# Patient Record
Sex: Female | Born: 2000 | Race: Black or African American | Hispanic: No | Marital: Single | Smoking: Never smoker
Health system: Southern US, Community
[De-identification: ages and names within clinical notes are randomized; demographics above are authoritative.]

## PROBLEM LIST (undated history)

## (undated) DIAGNOSIS — F329 Major depressive disorder, single episode, unspecified: Secondary | ICD-10-CM

## (undated) DIAGNOSIS — F32A Depression, unspecified: Secondary | ICD-10-CM

## (undated) DIAGNOSIS — F419 Anxiety disorder, unspecified: Secondary | ICD-10-CM

## (undated) HISTORY — DX: Depression, unspecified: F32.A

## (undated) HISTORY — DX: Anxiety disorder, unspecified: F41.9

---
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
## 2020-02-03 ENCOUNTER — Telehealth (INDEPENDENT_AMBULATORY_CARE_PROVIDER_SITE_OTHER): Payer: Self-pay | Admitting: Psychiatry

## 2020-02-03 ENCOUNTER — Encounter (HOSPITAL_COMMUNITY): Payer: Self-pay | Admitting: Psychiatry

## 2020-02-03 ENCOUNTER — Other Ambulatory Visit: Payer: Self-pay

## 2020-02-03 DIAGNOSIS — F3342 Major depressive disorder, recurrent, in full remission: Secondary | ICD-10-CM

## 2020-02-03 MED ORDER — BUPROPION HCL ER (XL) 150 MG PO TB24: 150.0000 mg | ORAL_TABLET | ORAL | 1 refills | Status: DC

## 2020-02-03 NOTE — Progress Notes (Signed)
BH MD OP Progress Note  Virtual Visit via Telephone Note  I connected with Tamara Peters on 02/03/20 at  4:00 PM EST by telephone and verified that I am speaking with the correct person using two identifiers.  Location: Patient: home Provider: Clinic   I discussed the limitations, risks, security and privacy concerns of performing an evaluation and management service by telephone and the availability of in person appointments. I also discussed with the patient that there may be a patient responsible charge related to this service. The patient expressed understanding and agreed to proceed.   I provided 14 minutes of non-face-to-face time during this encounter.   02/03/2020 4:04 PM Tamara Peters  MRN:  [id number]  Chief Complaint:  " Its been going all right."  HPI: Patient reported that everything is going well.  She had a good Christmas holiday with her family.  She is currently at home in [place] and is attending online classes in [place].  She is returning back to [place] to live on campus in the next couple of weeks.  She stated that everything is going well for now and she is stable on her current regimen.  She denies any acute issues or concerns at this time.  Visit Diagnosis:    ICD-10-CM   1. Recurrent major depressive disorder, in full remission (HCC)  F33.42     Past Psychiatric History: MDD  Past Medical History:  Past Medical History:  Diagnosis Date  . Anxiety   . Depression    No past surgical history on file.  Family Psychiatric History: denied  Family History: No family history on file.  Social History:  Social History   Socioeconomic History  . Marital status: Single    Spouse name: Not on file  . Number of children: 0  . Years of education: Not on file  . Highest education level: 12th grade  Occupational History  . Not on file  Tobacco Use  . Smoking status: Never Smoker  . Smokeless tobacco: Never Used  Vaping Use  . Vaping  Use: Never used  Substance and Sexual Activity  . Alcohol use: Never    Alcohol/week: 0.0 standard drinks  . Drug use: Never  . Sexual activity: Never  Other Topics Concern  . Not on file  Social History Narrative  . Not on file   Social Determinants of Health   Financial Resource Strain: Not on file  Food Insecurity: Not on file  Transportation Needs: Not on file  Physical Activity: Not on file  Stress: Not on file  Social Connections: Not on file    Allergies:  Allergies  Allergen Reactions  . Penicillins Rash    Metabolic Disorder Labs: No results found for: HGBA1C, MPG No results found for: PROLACTIN No results found for: CHOL, TRIG, HDL, CHOLHDL, VLDL, LDLCALC No results found for: TSH  Therapeutic Level Labs: No results found for: LITHIUM No results found for: VALPROATE No components found for:  CBMZ  Current Medications: Current Outpatient Medications  Medication Sig Dispense Refill  . buPROPion (WELLBUTRIN XL) 150 MG 24 hr tablet Take 1 tablet (150 mg total) by mouth every morning. 90 tablet 1  . norethindrone-ethinyl estradiol (LOESTRIN FE) 1-20 MG-MCG tablet Take by mouth.     No current facility-administered medications for this visit.     Musculoskeletal: Strength & Muscle Tone: unable to assess due to telemed visit Gait & Station: unable to assess due to telemed visit Patient leans: unable to assess due  to telemed visit   Psychiatric Specialty Exam: Review of Systems  There were no vitals taken for this visit.There is no height or weight on file to calculate BMI.  General Appearance: unable to asses due to phone visit  Eye Contact:  unable to asses due to phone visit  Speech:  Clear and Coherent and Normal Rate  Volume:  Normal  Mood:  Euthymic  Affect:  Appropriate and Congruent  Thought Process:  Goal Directed, Linear and Descriptions of Associations: Intact  Orientation:  Full (Time, Place, and Person)  Thought Content: Logical    Suicidal Thoughts:  No  Homicidal Thoughts:  No  Memory:  Recent;   Good Remote;   Good  Judgement:  Fair  Insight:  Fair  Psychomotor Activity:  Normal  Concentration:  Concentration: Good and Attention Span: Good  Recall:  Good  Fund of Knowledge: Good  Language: Good  Akathisia:  Negative  Handed:  Right  AIMS (if indicated): not done  Assets:  Communication Skills Desire for Improvement Financial Resources/Insurance Housing Social Support Talents/Skills  ADL's:  Intact  Cognition: WNL  Sleep:  Good     Assessment and Plan: Patient denies any new concerns or issues today..  1. Recurrent major depressive disorder, in full remission (HCC)  - buPROPion (WELLBUTRIN XL) 150 MG 24 hr tablet; Take 1 tablet (150 mg total) by mouth every morning.  Dispense: 90 tablet; Refill: 1   Continue same medication regimen. Follow up in 3 months.   Zena Amos, MD 02/03/2020, 4:04 PM

## 2020-03-22 ENCOUNTER — Telehealth (HOSPITAL_COMMUNITY): Payer: Self-pay | Admitting: Psychiatry

## 2020-03-22 ENCOUNTER — Other Ambulatory Visit: Payer: Self-pay

## 2020-03-22 NOTE — Telephone Encounter (Signed)
Pt was scheduled for an urgent virtual visit today at 10 am, however, when the writer sent the link her mother contacted the front desk staff to inform that pt was currently at [facility] ER and had been evaluated by psychiatry team and they were recommending in-patient stabilization.  The appointment with the writer was therefore cancelled.

## 2020-04-06 ENCOUNTER — Encounter (HOSPITAL_COMMUNITY): Payer: Self-pay | Admitting: Psychiatry

## 2020-04-06 ENCOUNTER — Telehealth (INDEPENDENT_AMBULATORY_CARE_PROVIDER_SITE_OTHER): Payer: Self-pay | Admitting: Psychiatry

## 2020-04-06 ENCOUNTER — Other Ambulatory Visit: Payer: Self-pay

## 2020-04-06 DIAGNOSIS — F3341 Major depressive disorder, recurrent, in partial remission: Secondary | ICD-10-CM

## 2020-04-06 MED ORDER — FLUOXETINE HCL 20 MG PO CAPS: 20.0000 mg | ORAL_CAPSULE | Freq: Every day | ORAL | 0 refills | Status: DC

## 2020-04-06 MED ORDER — BUPROPION HCL ER (XL) 150 MG PO TB24: 150.0000 mg | ORAL_TABLET | ORAL | 0 refills | Status: DC

## 2020-04-06 MED ORDER — TRAZODONE HCL 100 MG PO TABS: 100.0000 mg | ORAL_TABLET | Freq: Every evening | ORAL | 0 refills | Status: DC | PRN

## 2020-04-06 NOTE — Progress Notes (Signed)
[facility] MD OP Progress Note  Virtual Visit via Video Note  I connected with Tamara Peters on 04/06/20 at 10:10 AM EDT by a video enabled telemedicine application and verified that I am speaking with the correct person using two identifiers.  Location: Patient: Work/[employer] Provider: Clinic   I discussed the limitations of evaluation and management by telemedicine and the availability of in person appointments. The patient expressed understanding and agreed to proceed.  I provided 18 minutes of non-face-to-face time during this encounter.  Extensive amount of time was spent in reviewing the EMR regarding her recent psychiatry ED visit and hospitalization.      04/06/2020 10:14 AM Tamara Peters  MRN:  [id number]  Chief Complaint:  " I am doing a little better."  HPI: Patient had last met with writer in early January 2022.  At that time patient reported that she was doing well and was planning to attend online classes with [employer] while living on campus and working at [employer] there.  Patient's mother had contacted the clinic in February to report the patient was not doing well and had expressed suicidal ideations.  Patient was taken to [facility] emergency department in [place] on February 26 where she was assessed by psychiatry team.  She was noted to have depression symptoms with active suicidal ideations with plan and therefore was recommended transfer to an inpatient psychiatry facility for further stabilization.  At that time patient had reported that she was very stressed and overwhelmed because of relationship conflict with her significant other.  She had undergone an abortion in November 2021 and since then had felt really guilty and depressed. Patient was transferred to [facility] psychiatry hospital where she stayed for 8 days.  Today, patient stated that she is feeling better compared to the past few months.  She reported that she was discharged on Wellbutrin XL  150 mg every morning, Prozac 20 mg daily and trazodone 200 mg at bedtime.  She stated that her mood is better but she feels sluggish during the day.  She was asked if she felt trazodone was too strong for her and she replied maybe yes.  She stated that trazodone has helped her immensely with sleep so she would like to continue it however is agreeable to cutting down the dose due to daytime sluggishness. She informed that she ended the relationship with her partner and in the way she is happy about that but also misses them.  She stated that anxiety much better now but not perfect. She has resumed her work at [employer] and is still attending classes online at [employer].  She informed that her mother has been very supportive of her and she has shared everything with her.  She has informed her about her abortion last year and everything else she has been through. Today, she denied anhedonia or feelings of helplessness or hopelessness.  She denied having any suicidal ideation since she got discharged from the hospital. She reported her concentration and appetite are okay.  Her sleep is improved with the help of trazodone but she feels kind of sluggish in the daytime and is agreeable to reducing the dose for the same reason. She denied having any urges to engage in self-injurious behaviors. She denied any hypomanic or manic symptoms. She denied any psychotic symptoms such as hallucinations or delusions.  She wants to continue same regimen for now and continue to focus on her work and school.  She does  not want to be connected with therapist at this juncture.    Visit Diagnosis:    ICD-10-CM   1. Recurrent major depressive disorder, in full remission ([place])  F33.42     Past Psychiatric History: MDD  Past Medical History:  Past Medical History:  Diagnosis Date  . Anxiety   . Depression    No past surgical history on file.  Family Psychiatric History: denied  Family History: No family history  on file.  Social History:  Social History   Socioeconomic History  . Marital status: Single    Spouse name: Not on file  . Number of children: 0  . Years of education: Not on file  . Highest education level: 12th grade  Occupational History  . Not on file  Tobacco Use  . Smoking status: Never Smoker  . Smokeless tobacco: Never Used  Vaping Use  . Vaping Use: Never used  Substance and Sexual Activity  . Alcohol use: Never    Alcohol/week: 0.0 standard drinks  . Drug use: Never  . Sexual activity: Never  Other Topics Concern  . Not on file  Social History Narrative  . Not on file   Social Determinants of Health   Financial Resource Strain: Not on file  Food Insecurity: Not on file  Transportation Needs: Not on file  Physical Activity: Not on file  Stress: Not on file  Social Connections: Not on file    Allergies:  Allergies  Allergen Reactions  . Penicillins Rash    Metabolic Disorder Labs: No results found for: HGBA1C, MPG No results found for: PROLACTIN No results found for: CHOL, TRIG, HDL, CHOLHDL, VLDL, LDLCALC No results found for: TSH  Therapeutic Level Labs: No results found for: LITHIUM No results found for: VALPROATE No components found for:  CBMZ  Current Medications: Current Outpatient Medications  Medication Sig Dispense Refill  . buPROPion (WELLBUTRIN XL) 150 MG 24 hr tablet Take 1 tablet (150 mg total) by mouth every morning. 90 tablet 1  . norethindrone-ethinyl estradiol (LOESTRIN FE) 1-20 MG-MCG tablet Take by mouth.     No current facility-administered medications for this visit.      Psychiatric Specialty Exam: Review of Systems  There were no vitals taken for this visit.There is no height or weight on file to calculate BMI.  General Appearance: Well Groomed  Eye Contact:  Good  Speech:  Clear and Coherent and Normal Rate  Volume:  Normal  Mood:  Slightly depressed  Affect:  Appropriate and Congruent  Thought Process:  Goal  Directed, Linear and Descriptions of Associations: Intact  Orientation:  Full (Time, Place, and Person)  Thought Content: Logical   Suicidal Thoughts:  No  Homicidal Thoughts:  No  Memory:  Recent;   Good Remote;   Good  Judgement:  Fair  Insight:  Fair  Psychomotor Activity:  Normal  Concentration:  Concentration: Good and Attention Span: Good  Recall:  Good  Fund of Knowledge: Good  Language: Good  Akathisia:  Negative  Handed:  Right  AIMS (if indicated): not done  Assets:  Communication Skills Desire for Improvement Financial Resources/Insurance Housing Social Support Talents/Skills  ADL's:  Intact  Cognition: WNL  Sleep:  Good     Assessment and Plan: Patient appears to be doing fairly well after being discharged from the hospital in February.  Her medication regimen was adjusted after her hospitalization and Prozac and trazodone were added to her regimen.  She reported that trazodone has helped her sleep  well but she feels kind of sluggish during the daytime and therefore was agreeable to reducing the dose to 100 mg from 200 mg.  1. MDD (major depressive disorder), recurrent, in partial remission (HCC)  - buPROPion (WELLBUTRIN XL) 150 MG 24 hr tablet; Take 1 tablet (150 mg total) by mouth every morning.  Dispense: 90 tablet; Refill: 0 - FLUoxetine (PROZAC) 20 MG capsule; Take 1 capsule (20 mg total) by mouth daily.  Dispense: 90 capsule; Refill: 0 - Reduce traZODone (DESYREL) 100 MG tablet; Take 1 tablet (100 mg total) by mouth at bedtime as needed for sleep.  Dispense: 90 tablet; Refill: 0  F/up in 6 weeks.   Nevada Crane, MD 04/06/2020, 10:14 AM

## 2020-04-29 ENCOUNTER — Telehealth (HOSPITAL_COMMUNITY): Payer: Self-pay | Admitting: Psychiatry

## 2020-05-18 ENCOUNTER — Other Ambulatory Visit: Payer: Self-pay

## 2020-05-18 ENCOUNTER — Telehealth (INDEPENDENT_AMBULATORY_CARE_PROVIDER_SITE_OTHER): Payer: Self-pay | Admitting: Psychiatry

## 2020-05-18 ENCOUNTER — Encounter (HOSPITAL_COMMUNITY): Payer: Self-pay | Admitting: Psychiatry

## 2020-05-18 DIAGNOSIS — F3341 Major depressive disorder, recurrent, in partial remission: Secondary | ICD-10-CM

## 2020-05-18 MED ORDER — FLUOXETINE HCL 20 MG PO CAPS: 20.0000 mg | ORAL_CAPSULE | Freq: Every day | ORAL | 0 refills | Status: DC

## 2020-05-18 MED ORDER — TRAZODONE HCL 100 MG PO TABS: 100.0000 mg | ORAL_TABLET | Freq: Every evening | ORAL | 0 refills | Status: AC | PRN

## 2020-05-18 NOTE — Progress Notes (Signed)
BH MD OP Progress Note  Virtual Visit via Video Note  I connected with Tamara Peters on 05/18/20 at  2:40 PM EDT by a video enabled telemedicine application and verified that I am speaking with the correct person using two identifiers.  Location: Patient: Work Provider: Clinic   I discussed the limitations of evaluation and management by telemedicine and the availability of in person appointments. The patient expressed understanding and agreed to proceed.  I provided 15 minutes of non-face-to-face time during this encounter.      05/18/2020 2:57 PM Tamara Peters  MRN:  [id number]  Chief Complaint:  " I have been busy with work."  HPI: Patient reported she is doing better.  She informed that she is staying busy with work.  She now works at [employer] in [place].  She informed that things have been going okay for her.  She has not had any contact with her ex. She has not had any suicidal ideations since her suicide attempt a couple of months ago. She informed that she noticed that Wellbutrin was making her really nauseous so she stopped taking it a few weeks ago and she feels better now.  She has also not been taking her trazodone on a regular basis because she can sleep well without it.  She has been taking Prozac regularly on a daily basis and finds it helpful. She stated that she has been doing online classes and was supposed to have some exams. She denies any other concerns or issues at this time.    Visit Diagnosis:    ICD-10-CM   1. MDD (major depressive disorder), recurrent, in partial remission (HCC)  F33.41 FLUoxetine (PROZAC) 20 MG capsule    traZODone (DESYREL) 100 MG tablet    Past Psychiatric History: MDD  Past Medical History:  Past Medical History:  Diagnosis Date  . Anxiety   . Depression    No past surgical history on file.  Family Psychiatric History: denied  Family History: No family history on file.  Social History:  Social History    Socioeconomic History  . Marital status: Single    Spouse name: Not on file  . Number of children: 0  . Years of education: Not on file  . Highest education level: 12th grade  Occupational History  . Not on file  Tobacco Use  . Smoking status: Never Smoker  . Smokeless tobacco: Never Used  Vaping Use  . Vaping Use: Never used  Substance and Sexual Activity  . Alcohol use: Never    Alcohol/week: 0.0 standard drinks  . Drug use: Never  . Sexual activity: Never  Other Topics Concern  . Not on file  Social History Narrative  . Not on file   Social Determinants of Health   Financial Resource Strain: Not on file  Food Insecurity: Not on file  Transportation Needs: Not on file  Physical Activity: Not on file  Stress: Not on file  Social Connections: Not on file    Allergies:  Allergies  Allergen Reactions  . Penicillins Rash    Metabolic Disorder Labs: No results found for: HGBA1C, MPG No results found for: PROLACTIN No results found for: CHOL, TRIG, HDL, CHOLHDL, VLDL, LDLCALC No results found for: TSH  Therapeutic Level Labs: No results found for: LITHIUM No results found for: VALPROATE No components found for:  CBMZ  Current Medications: Current Outpatient Medications  Medication Sig Dispense Refill  . FLUoxetine (PROZAC) 20 MG capsule Take 1 capsule (20  mg total) by mouth daily. 90 capsule 0  . norethindrone-ethinyl estradiol (LOESTRIN FE) 1-20 MG-MCG tablet Take by mouth.    . traZODone (DESYREL) 100 MG tablet Take 1 tablet (100 mg total) by mouth at bedtime as needed for sleep. 90 tablet 0   No current facility-administered medications for this visit.      Psychiatric Specialty Exam: Review of Systems  There were no vitals taken for this visit.There is no height or weight on file to calculate BMI.  General Appearance: Well Groomed, wearing a work uniform  Patent attorney:  Good  Speech:  Clear and Coherent and Normal Rate  Volume:  Normal  Mood:   Euthymic  Affect:  Appropriate and Congruent  Thought Process:  Goal Directed, Linear and Descriptions of Associations: Intact  Orientation:  Full (Time, Place, and Person)  Thought Content: Logical   Suicidal Thoughts:  No  Homicidal Thoughts:  No  Memory:  Recent;   Good Remote;   Good  Judgement:  Fair  Insight:  Fair  Psychomotor Activity:  Normal  Concentration:  Concentration: Good and Attention Span: Good  Recall:  Good  Fund of Knowledge: Good  Language: Good  Akathisia:  Negative  Handed:  Right  AIMS (if indicated): not done  Assets:  Communication Skills Desire for Improvement Financial Resources/Insurance Housing Social Support Talents/Skills  ADL's:  Intact  Cognition: WNL  Sleep:  Good     Assessment and Plan: Patient appears to be doing fairly well for now.  She stopped taking Wellbutrin due to nausea.  She also continues trazodone on a regular basis as she is able to sleep without it.  1. MDD (major depressive disorder), recurrent, in partial remission (HCC) -  FLUoxetine (PROZAC) 20 MG capsule; Take 1 capsule (20 mg total) by mouth daily.  Dispense: 90 capsule; Refill: 0 - traZODone (DESYREL) 100 MG tablet; Take 1 tablet (100 mg total) by mouth at bedtime as needed for sleep.  Dispense: 90 tablet; Refill: 0  Continue same medication regimen. Follow up in 2 months. Patient was informed that her care is being transferred to a different provider at [facility] psychiatry clinic.  She verbalized her understanding.    Zena Amos, MD 05/18/2020, 2:57 PM

## 2020-06-18 ENCOUNTER — Ambulatory Visit: Payer: BC Managed Care – PPO

## 2020-06-28 ENCOUNTER — Ambulatory Visit: Payer: BC Managed Care – PPO | Admitting: Physician Assistant

## 2020-06-28 ENCOUNTER — Other Ambulatory Visit: Payer: Self-pay

## 2020-06-28 DIAGNOSIS — N760 Acute vaginitis: Secondary | ICD-10-CM

## 2020-06-28 DIAGNOSIS — Z113 Encounter for screening for infections with a predominantly sexual mode of transmission: Secondary | ICD-10-CM

## 2020-06-28 DIAGNOSIS — B9689 Other specified bacterial agents as the cause of diseases classified elsewhere: Secondary | ICD-10-CM

## 2020-06-28 LAB — WET PREP FOR TRICH, YEAST, CLUE
Trichomonas Exam: NEGATIVE
Yeast Exam: NEGATIVE

## 2020-06-28 MED ORDER — METRONIDAZOLE 500 MG PO TABS: 500.0000 mg | ORAL_TABLET | Freq: Two times a day (BID) | ORAL | 0 refills | Status: AC

## 2020-06-28 NOTE — Progress Notes (Signed)
Wet mount reviewed by provider. Orders completed

## 2020-06-29 ENCOUNTER — Encounter: Payer: Self-pay | Admitting: Physician Assistant

## 2020-06-29 NOTE — Progress Notes (Signed)
[facility] Department STI clinic/screening visit  Subjective:  Tamara Peters is a 20 y.o. female being seen today for an STI screening visit. The patient reports they do have symptoms.  Patient reports that they do not desire a pregnancy in the next year.   They reported they are not interested in discussing contraception today.  No LMP recorded.   Patient has the following medical conditions:   Patient Active Problem List   Diagnosis Date Noted  . Recurrent major depressive disorder, in full remission (HCC) 03/27/2019  . MDD (major depressive disorder), recurrent, in partial remission (HCC) 01/03/2019  . Current severe episode of major depressive disorder without psychotic features without prior episode (HCC) 04/05/2018  . Intentional drug overdose (HCC) 04/03/2018  . Intentional acetaminophen overdose (HCC) 04/03/2018  . Menorrhagia with regular cycle 12/07/2017    Chief Complaint  Patient presents with  . SEXUALLY TRANSMITTED DISEASE    STD Screen    HPI  Patient reports that she has had a vaginal odor for 2 weeks.  Denies other symptoms and history of surgeries.  States that she is using Xulane patch for [facility] and LMP was about 6 weeks ago.  States last HIV test was 06/25/2020 and has not had a pap since she is not yet 20 yr old.  Reports that she is in therapy for Anxiety and Depression and takes medicine prescribed as directed.   See flowsheet for further details and programmatic requirements.    The following portions of the patient's history were reviewed and updated as appropriate: allergies, current medications, past medical history, past social history, past surgical history and problem list.  Objective:  There were no vitals filed for this visit.  Physical Exam Constitutional:      General: She is not in acute distress.    Appearance: Normal appearance.  HENT:     Head: Normocephalic and atraumatic.     Comments: No nits,lice, or hair loss. No  cervical, supraclavicular or axillary adenopathy.    Mouth/Throat:     Mouth: Mucous membranes are moist.     Pharynx: Oropharynx is clear. No oropharyngeal exudate or posterior oropharyngeal erythema.  Eyes:     Conjunctiva/sclera: Conjunctivae normal.  Pulmonary:     Effort: Pulmonary effort is normal.  Genitourinary:    Rectum: Normal.  Musculoskeletal:     Cervical back: Neck supple. No tenderness.  Skin:    General: Skin is warm and dry.     Findings: No bruising, erythema, lesion or rash.  Neurological:     Mental Status: She is alert and oriented to person, place, and time.  Psychiatric:        Mood and Affect: Mood normal.        Behavior: Behavior normal.        Thought Content: Thought content normal.        Judgment: Judgment normal.      Assessment and Plan:  [name] is a 20 y.o. female presenting to the [facility] Department for STI screening  1. Screening for STD (sexually transmitted disease) Patient into clinic with symptoms. Patient declines blood work today. Patient declines pelvic exam by provider and opts to self-collect vaginal samples for testing.  Counseled how to collect for accurate results.  Rec condoms with all sex. Await test results.  Counseled that RN will call if needs to RTC for treatment once results are back. - WET PREP FOR TRICH, YEAST, CLUE - Gonococcus culture - Chlamydia/Gonorrhea  [facility]  2. BV (bacterial vaginosis) Treat for BV with Metronidazole 500 mg #14 1 po BID for 7 days with food, no EtOH for 24 hr before and until 72 hr after completing medicine. No sex for 10 days. Enc to use OTC antifungal cream if has itching during or just after antibiotic use. - metroNIDAZOLE (FLAGYL) 500 MG tablet; Take 1 tablet (500 mg total) by mouth 2 (two) times daily for 7 days.  Dispense: 14 tablet; Refill: 0     No follow-ups on file.  Future Appointments  Date Time Provider Department Center  07/15/2020  4:00  PM Neysa Hotter, MD [facility] None    Matt Holmes, [place]

## 2020-07-02 LAB — GONOCOCCUS CULTURE

## 2020-07-12 NOTE — Progress Notes (Signed)
Virtual Visit via Video Note  I connected with Sanari A Spraker on 07/15/20 at  4:00 PM EDT by a video enabled telemedicine application and verified that I am speaking with the correct person using two identifiers.  Location: Patient: home Provider: office Persons participated in the visit- patient, provider    I discussed the limitations of evaluation and management by telemedicine and the availability of in person appointments. The patient expressed understanding and agreed to proceed.   I discussed the assessment and treatment plan with the patient. The patient was provided an opportunity to ask questions and all were answered. The patient agreed with the plan and demonstrated an understanding of the instructions.   The patient was advised to call back or seek an in-person evaluation if the symptoms worsen or if the condition fails to improve as anticipated.  I provided 30 minutes of non-face-to-face time during this encounter.   Neysa Hotter, MD    [facility] MD/PA/NP OP Progress Note  07/15/2020 4:42 PM ZLATA ALCAIDE  MRN:  [id number]  Chief Complaint:  Chief Complaint   Anxiety; Depression    HPI:  JONELLE BANN is a 20 y.o. year old female with a history of depression, who is transferred from Dr. Evelene Croon.   She states that she has been doing fine.  She tries not to miss the school.  She is currently a sophomore, and stays at her mother's place during summer to go to [employer]  She will move to the apartment with her roommate for the next semester. She states that she likes college better than high school as she is more social.  However, she feels lonely at times.  She states that she was also feeling lonely a few months ago when she went to the hospital.  Although she lost 20 pounds due to appetite loss around that time, it has been getting better.  She enjoys seeing her friends, and reports good support from her mother.  When she was asked, she states that she was  ended relationship with her boyfriend of 1 year unexpectedly.  She has not contacted with him.  Although she misses him time to time, she thinks it has been getting better.  She will move to [place] in August.  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  Although she cannot explain why, she knows that that is what she wants to do.  Although her depression has been getting better, she tends to feel more anxious. She also feels anxious about financial strain, although she wants to buy a car. She feels fatigue during the day, although she has been able to do more things. She usually feels better when she is around with people.  She denies panic attacks.  She has occasional insomnia.  She denies snoring. She denies SI.  She denies decreased need for sleep or euphonia.  She denies hallucinations.  She denies alcohol use or drug use.   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  She has a strange relationship with her father.  She reports that she is an only child, and she states that "everything is fine" during childhood.  Although she describes her high school as "not favorite years" as she was not socializing, it went fine.   Medication- fluoxetine 20 mg daily. She rarely takes Trazodone. Discontinued bupropion due  to fatigue   Wt Readings from Last 3 Encounters:  04/02/18 125 lb (56.7 kg) (56 %, Z= 0.15)*  09/03/14 120 lb (54.4 kg) (73 %, Z= 0.60)*   * Growth percentiles are based on CDC (Girls, 2-20 Years) data.        Visit Diagnosis:    ICD-10-CM   1. MDD (major depressive disorder), recurrent episode, mild (HCC)  F33.0 TSH      Past Psychiatric History:  Outpatient: Dr. Evelene Croon Psychiatry admission: [facility] in 2019 after overdosing Tylenol, IVC'd at [facility] psychiatry hospital in Feb 2022 for SI with  worsening in depression in the context of ongoing relationship with her partner/abortion in November 2021. Previous suicide attempt: once, overdosed Tylenol in 2019 Past trials of medication: bupropion (weight loss) History of violence:    Past Medical History:  Past Medical History:  Diagnosis Date   Anxiety    Depression    History reviewed. No pertinent surgical history.  Family Psychiatric History: as below  Family History: History reviewed. No pertinent family history.  Social History:  Social History   Socioeconomic History   Marital status: Single    Spouse name: Not on file   Number of children: 0   Years of education: Not on file   Highest education level: 12th grade  Occupational History   Not on file  Tobacco Use   Smoking status: Never   Smokeless tobacco: Never  Vaping Use   Vaping Use: Never used  Substance and Sexual Activity   Alcohol use: Never    Alcohol/week: 0.0 standard drinks   Drug use: Never   Sexual activity: Never  Other Topics Concern   Not on file  Social History Narrative   Not on file   Social Determinants of Health   Financial Resource Strain: Not on file  Food Insecurity: Not on file  Transportation Needs: Not on file  Physical Activity: Not on file  Stress: Not on file  Social Connections: Not on file    Allergies:  Allergies  Allergen Reactions   Penicillins Rash    Metabolic Disorder Labs: No results found for: HGBA1C, MPG No results found for: PROLACTIN No results found for: CHOL, TRIG, HDL, CHOLHDL, VLDL, LDLCALC No results found for: TSH  Therapeutic Level Labs: No results found for: LITHIUM No results found for: VALPROATE No components found for:  CBMZ  Current Medications: Current Outpatient Medications  Medication Sig Dispense Refill   FLUoxetine (PROZAC) 40 MG capsule Take 1 capsule (40 mg total) by mouth daily. 30 capsule 1   traZODone (DESYREL) 100 MG tablet Take 1 tablet (100 mg total) by mouth at  bedtime as needed for sleep. (Patient not taking: Reported on 06/28/2020) 90 tablet 0   XULANE 150-35 MCG/24HR transdermal patch 1 patch once a week.     No current facility-administered medications for this visit.     Musculoskeletal: Strength & Muscle Tone:  N/A Gait & Station:  N/A Patient leans: N/A  Psychiatric Specialty Exam: Review of Systems  Psychiatric/Behavioral:  Positive for sleep disturbance. Negative for agitation, behavioral problems, confusion, decreased concentration, dysphoric mood, hallucinations, self-injury and suicidal ideas. The patient is nervous/anxious. The patient is not hyperactive.   All other systems reviewed and are negative.  There were no vitals taken for this visit.There is no height or weight on file to calculate BMI.  General Appearance: Fairly Groomed  Eye Contact:  Good  Speech:  Clear and Coherent  Volume:  Normal  Mood:  Anxious  Affect:  Appropriate, Congruent, and down at times, but reactive  Thought Process:  Coherent  Orientation:  Full (Time, Place, and Person)  Thought Content: Logical   Suicidal Thoughts:  No  Homicidal Thoughts:  No  Memory:  Immediate;   Good  Judgement:  Good  Insight:  Fair    Psychomotor Activity:  Normal  Concentration:  Concentration: Good and Attention Span: Good  Recall:  Good  Fund of Knowledge: Good  Language: Good  Akathisia:  No  Handed:  Right  AIMS (if indicated): not done  Assets:  Communication Skills Desire for Improvement  ADL's:  Intact  Cognition: WNL  Sleep:  Fair   Screenings: Flowsheet Row Video Visit [facility]/2020 [facility]  C-SSRS RISK CATEGORY No Risk        Assessment and Plan:  DEZIREA MCCOLLISTER is a 20 y.o. year old female with a history of depression, who is transferred from Dr. Evelene Croon.   1. MDD (major depressive disorder), recurrent episode, mild (HCC) Although she reports improvement in her depressive symptoms, she continues to  report anxiety, and exam is notable for slightly down affect.  Recent psychosocial stressors includes breaking up with the partner. Will do further up titration of fluoxetine to optimize treatment for depression and anxiety.  Discussed potential risk of worsening in SI in younger population.  She will greatly benefit from CBT; will make referral.   Plan Increase fluoxetine 40 mg daily Continue trazodone 100 mg at night as needed for insomnia-she rarely takes this medication Next appointment; August 10 at 9 AM, video Obtain labs- TSH Referral to therapy  The patient demonstrates the following risk factors for suicide: Chronic risk factors for suicide include: psychiatric disorder of depression, anxiety and previous suicide attempts of overdosing medication . Acute risk factors for suicide include: loss (financial, interpersonal, professional) and recent discharge from inpatient psychiatry. Protective factors for this patient include: positive social support and hope for the future. Considering these factors, the overall suicide risk at this point appears to be low. Patient is appropriate for outpatient follow up.      Neysa Hotter, MD 07/15/2020, 4:42 PM

## 2020-07-15 ENCOUNTER — Telehealth (INDEPENDENT_AMBULATORY_CARE_PROVIDER_SITE_OTHER): Payer: BC Managed Care – PPO | Admitting: Psychiatry

## 2020-07-15 ENCOUNTER — Encounter: Payer: Self-pay | Admitting: Psychiatry

## 2020-07-15 ENCOUNTER — Other Ambulatory Visit: Payer: Self-pay

## 2020-07-15 DIAGNOSIS — F330 Major depressive disorder, recurrent, mild: Secondary | ICD-10-CM | POA: Diagnosis not present

## 2020-07-15 MED ORDER — FLUOXETINE HCL 40 MG PO CAPS: 40.0000 mg | ORAL_CAPSULE | Freq: Every day | ORAL | 1 refills | Status: DC

## 2020-07-15 NOTE — Patient Instructions (Signed)
Increase fluoxetine 40 mg daily Continue trazodone 100 mg at night as needed for insomnia-she rarely takes this medication Next appointment; August 10 at 9 AM, video Obtain labs- TSH

## 2020-08-25 NOTE — Progress Notes (Signed)
Virtual Visit via Video Note  I connected with Tamara Peters on 09/01/20 at  9:00 AM EDT by a video enabled telemedicine application and verified that I am speaking with the correct person using two identifiers.  Location: Patient: home Provider: office Persons participated in the visit- patient, provider    I discussed the limitations of evaluation and management by telemedicine and the availability of in person appointments. The patient expressed understanding and agreed to proceed.     I discussed the assessment and treatment plan with the patient. The patient was provided an opportunity to ask questions and all were answered. The patient agreed with the plan and demonstrated an understanding of the instructions.   The patient was advised to call back or seek an in-person evaluation if the symptoms worsen or if the condition fails to improve as anticipated.  I provided 11 minutes of non-face-to-face time during this encounter.   Neysa Hotter, MD    [facility] MD/PA/NP OP Progress Note  09/01/2020 9:39 AM Tamara Peters  MRN:  [id number]  Chief Complaint:  Chief Complaint   Follow-up; Depression    HPI:  This is a follow-up appointment for depression and anxiety.  She states that she has been doing better.  She continues to work at [employer] as a part-time.  She has been sleeping well most of the days despite she works until 1 AM.  She enjoys going out with her friends when she does not work.  She is excited to return to work.  She is looking forward to going to a larger city, and connect with her friends.  She has been taking a walk around, although it is hot.  She agrees to do it more regularly when the weather is better.  She is hoping to get a good grade and find a therapist when the semester starts.  Although she continues to think about her ex-boyfriend at times, it has been getting less, and she denies any concern at this time.  She denies feeling depressed.  There was only  a 1 time she felt depressed, although she cannot recollect why she was feeling that way.  She denies anhedonia.  She denies SI.  She feels comfortable to continue her medication.   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  She has a strange relationship with her father.  She reports that she is an only child, and she states that "everything is fine" during childhood.  Although she describes her high school as "not favorite years" as she was not socializing, it went fine.  Visit Diagnosis:    ICD-10-CM   1. MDD (major depressive disorder), recurrent episode, mild (HCC)  F33.0       Past Psychiatric History: I have reviewed and updated plans as below   Past Medical History:  Past Medical History:  Diagnosis Date   Anxiety    Depression    No past surgical history on file.  Family Psychiatric History: Please see initial evaluation for full details. I have reviewed the history. No updates at this time.     Family History: No family history on file.  Social History:  Social History   Socioeconomic History   Marital status: Single    Spouse name: Not on file   Number of children: 0   Years of education: Not on file  Highest education level: 12th grade  Occupational History   Not on file  Tobacco Use   Smoking status: Never   Smokeless tobacco: Never  Vaping Use   Vaping Use: Never used  Substance and Sexual Activity   Alcohol use: Never    Alcohol/week: 0.0 standard drinks   Drug use: Never   Sexual activity: Never  Other Topics Concern   Not on file  Social History Narrative   Not on file   Social Determinants of Health   Financial Resource Strain: Not on file  Food Insecurity: Not on file  Transportation Needs: Not on file  Physical Activity: Not on file   Stress: Not on file  Social Connections: Not on file    Allergies:  Allergies  Allergen Reactions   Penicillins Rash    Metabolic Disorder Labs: No results found for: HGBA1C, MPG No results found for: PROLACTIN No results found for: CHOL, TRIG, HDL, CHOLHDL, VLDL, LDLCALC No results found for: TSH  Therapeutic Level Labs: No results found for: LITHIUM No results found for: VALPROATE No components found for:  CBMZ  Current Medications: Current Outpatient Medications  Medication Sig Dispense Refill   [START ON 09/14/2020] FLUoxetine (PROZAC) 40 MG capsule Take 1 capsule (40 mg total) by mouth Peters. 30 capsule 1   traZODone (DESYREL) 100 MG tablet Take 1 tablet (100 mg total) by mouth at bedtime as needed for sleep. (Patient not taking: Reported on 06/28/2020) 90 tablet 0   XULANE 150-35 MCG/24HR transdermal patch 1 patch once a week.     No current facility-administered medications for this visit.     Musculoskeletal: Strength & Muscle Tone:  N/A Gait & Station:  N/A Patient leans: N/A  Psychiatric Specialty Exam: Review of Systems  Psychiatric/Behavioral:  Negative for agitation, behavioral problems, confusion, decreased concentration, dysphoric mood, hallucinations, self-injury, sleep disturbance and suicidal ideas. The patient is nervous/anxious. The patient is not hyperactive.   All other systems reviewed and are negative.  There were no vitals taken for this visit.There is no height or weight on file to calculate BMI.  General Appearance: Fairly Groomed  Eye Contact:  Good  Speech:  Clear and Coherent  Volume:  Normal  Mood:   "chilled"  Affect:  Appropriate, Congruent, and euthymic, smiles  Thought Process:  Coherent  Orientation:  Full (Time, Place, and Person)  Thought Content: Logical   Suicidal Thoughts:  No  Homicidal Thoughts:  No  Memory:  Immediate;   Good  Judgement:  Good  Insight:  Good  Psychomotor Activity:  Normal  Concentration:   Concentration: Good and Attention Span: Good  Recall:  Good  Fund of Knowledge: Good  Language: Good  Akathisia:  No  Handed:  Right  AIMS (if indicated): not done  Assets:  Communication Skills Desire for Improvement  ADL's:  Intact  Cognition: WNL  Sleep:  Fair   Screenings: PHQ2-9    Flowsheet Row Video Visit from 09/01/2020 in [facility]  PHQ-2 Total Score 1      Flowsheet Row Video Visit from 09/01/2020 in [facility] Video Visit from 07/15/2020 in [facility]  C-SSRS RISK CATEGORY No Risk No Risk        Assessment and Plan:  VIOLET SEABURY is a 20 y.o. year old female with a history of depression, who presents for follow up appointment for below.   1. MDD (major depressive disorder), recurrent episode, mild (HCC) Exam is notable for brighter  affect , and there has been overall improvement in depressive symptoms since uptitration of fluoxetine.  Psychosocial stressors includes breaking up with her partner.  She is well engaged with her social connection.  We will continue current dose of fluoxetine to target depression and anxiety.  She was strongly encouraged to contact with the local resources given she will move to [place].   Plan Continue fluoxetine 40 mg Peters Continue trazodone 100 mg at night as needed for insomnia-she rarely takes this medication Next appointment: 10/3 at 11:30 for 30 mins, video Obtain labs- TSH She is advised to find a therapist in [place]   The patient demonstrates the following risk factors for suicide: Chronic risk factors for suicide include: psychiatric disorder of depression, anxiety and previous suicide attempts of overdosing medication . Acute risk factors for suicide include: loss (financial, interpersonal, professional) and recent discharge from inpatient psychiatry. Protective factors for this patient include: positive social support and hope  for the future. Considering these factors, the overall suicide risk at this point appears to be low. Patient is appropriate for outpatient follow up.       Neysa Hotter, MD 09/01/2020, 9:39 AM

## 2020-09-01 ENCOUNTER — Encounter: Payer: Self-pay | Admitting: Psychiatry

## 2020-09-01 ENCOUNTER — Other Ambulatory Visit: Payer: Self-pay

## 2020-09-01 ENCOUNTER — Telehealth (INDEPENDENT_AMBULATORY_CARE_PROVIDER_SITE_OTHER): Payer: BC Managed Care – PPO | Admitting: Psychiatry

## 2020-09-01 DIAGNOSIS — F330 Major depressive disorder, recurrent, mild: Secondary | ICD-10-CM | POA: Diagnosis not present

## 2020-09-01 MED ORDER — FLUOXETINE HCL 40 MG PO CAPS: 40.0000 mg | ORAL_CAPSULE | Freq: Every day | ORAL | 1 refills | Status: DC

## 2020-09-01 NOTE — Patient Instructions (Signed)
Continue fluoxetine 40 mg daily Continue trazodone 100 mg at night as needed for insomnia Next appointment: 10/3 at 11:30

## 2020-10-24 NOTE — Progress Notes (Deleted)
[facility] MD/PA/NP OP Progress Note  10/24/2020 3:40 AM Tamara Peters  MRN:  [id number]  Chief Complaint:  HPI: *** Visit Diagnosis: No diagnosis found.  Past Psychiatric History: Please see initial evaluation for full details. I have reviewed the history. No updates at this time.     Past Medical History:  Past Medical History:  Diagnosis Date   Anxiety    Depression    No past surgical history on file.  Family Psychiatric History: Please see initial evaluation for full details. I have reviewed the history. No updates at this time.     Family History: No family history on file.  Social History:  Social History   Socioeconomic History   Marital status: Single    Spouse name: Not on file   Number of children: 0   Years of education: Not on file   Highest education level: 12th grade  Occupational History   Not on file  Tobacco Use   Smoking status: Never   Smokeless tobacco: Never  Vaping Use   Vaping Use: Never used  Substance and Sexual Activity   Alcohol use: Never    Alcohol/week: 0.0 standard drinks   Drug use: Never   Sexual activity: Never  Other Topics Concern   Not on file  Social History Narrative   Not on file   Social Determinants of Health   Financial Resource Strain: Not on file  Food Insecurity: Not on file  Transportation Needs: Not on file  Physical Activity: Not on file  Stress: Not on file  Social Connections: Not on file    Allergies:  Allergies  Allergen Reactions   Penicillins Rash    Metabolic Disorder Labs: No results found for: HGBA1C, MPG No results found for: PROLACTIN No results found for: CHOL, TRIG, HDL, CHOLHDL, VLDL, LDLCALC No results found for: TSH  Therapeutic Level Labs: No results found for: LITHIUM No results found for: VALPROATE No components found for:  CBMZ  Current Medications: Current Outpatient Medications  Medication Sig Dispense Refill   FLUoxetine (PROZAC) 40 MG capsule Take 1 capsule (40 mg  total) by mouth daily. 30 capsule 1   traZODone (DESYREL) 100 MG tablet Take 1 tablet (100 mg total) by mouth at bedtime as needed for sleep. (Patient not taking: Reported on 06/28/2020) 90 tablet 0   XULANE 150-35 MCG/24HR transdermal patch 1 patch once a week.     No current facility-administered medications for this visit.     Musculoskeletal: Strength & Muscle Tone:  N/A Gait & Station:  N/A Patient leans: N/A  Psychiatric Specialty Exam: Review of Systems  There were no vitals taken for this visit.There is no height or weight on file to calculate BMI.  General Appearance: {Appearance:22683}  Eye Contact:  {BHH EYE CONTACT:22684}  Speech:  Clear and Coherent  Volume:  Normal  Mood:  {BHH MOOD:22306}  Affect:  {Affect (PAA):22687}  Thought Process:  Coherent  Orientation:  Full (Time, Place, and Person)  Thought Content: Logical   Suicidal Thoughts:  {ST/HT (PAA):22692}  Homicidal Thoughts:  {ST/HT (PAA):22692}  Memory:  Immediate;   Good  Judgement:  {Judgement (PAA):22694}  Insight:  {Insight (PAA):22695}  Psychomotor Activity:  Normal  Concentration:  Concentration: Good and Attention Span: Good  Recall:  Good  Fund of Knowledge: Good  Language: Good  Akathisia:  No  Handed:  Right  AIMS (if indicated): not done  Assets:  Communication Skills Desire for Improvement  ADL's:  Intact  Cognition: WNL  Sleep:  {BHH GOOD/FAIR/POOR:22877}   Screenings: PHQ2-9    Flowsheet Row Video Visit from 09/01/2020 in [facility]  PHQ-2 Total Score 1      Flowsheet Row Video Visit from 09/01/2020 in [facility] Video Visit from 07/15/2020 in [facility]  C-SSRS RISK CATEGORY No Risk No Risk        Assessment and Plan:  Tamara Peters is a 20 y.o. year old female with a history of depression, who presents for follow up appointment for below.     1. MDD (major depressive disorder),  recurrent episode, mild (HCC) Exam is notable for brighter affect , and there has been overall improvement in depressive symptoms since uptitration of fluoxetine.  Psychosocial stressors includes breaking up with her partner.  She is well engaged with her social connection.  We will continue current dose of fluoxetine to target depression and anxiety.  She was strongly encouraged to contact with the local resources given she will move to [place].    Plan Continue fluoxetine 40 mg daily Continue trazodone 100 mg at night as needed for insomnia-she rarely takes this medication Next appointment: 10/3 at 11:30 for 30 mins, video Obtain labs- TSH She is advised to find a therapist in [place]   The patient demonstrates the following risk factors for suicide: Chronic risk factors for suicide include: psychiatric disorder of depression, anxiety and previous suicide attempts of overdosing medication . Acute risk factors for suicide include: loss (financial, interpersonal, professional) and recent discharge from inpatient psychiatry. Protective factors for this patient include: positive social support and hope for the future. Considering these factors, the overall suicide risk at this point appears to be low. Patient is appropriate for outpatient follow up.  Neysa Hotter, MD 10/24/2020, 3:40 AM

## 2020-10-25 ENCOUNTER — Telehealth: Payer: BC Managed Care – PPO | Admitting: Psychiatry

## 2020-11-01 ENCOUNTER — Telehealth: Payer: Self-pay

## 2020-11-01 ENCOUNTER — Other Ambulatory Visit: Payer: Self-pay | Admitting: Psychiatry

## 2020-11-01 MED ORDER — FLUOXETINE HCL 40 MG PO CAPS: 40.0000 mg | ORAL_CAPSULE | Freq: Every day | ORAL | 0 refills | Status: AC

## 2020-11-01 NOTE — Telephone Encounter (Signed)
Ordered

## 2020-11-01 NOTE — Telephone Encounter (Signed)
error 

## 2020-11-01 NOTE — Telephone Encounter (Signed)
received fax requestin a  refill on the fluxoteine

## 2020-11-15 NOTE — Progress Notes (Deleted)
[facility] MD/PA/NP OP Progress Note  11/15/2020 5:31 PM Tamara Peters  MRN:  [id number]  Chief Complaint:  HPI: *** Visit Diagnosis: No diagnosis found.  Past Psychiatric History: Please see initial evaluation for full details. I have reviewed the history. No updates at this time.     Past Medical History:  Past Medical History:  Diagnosis Date   Anxiety    Depression    No past surgical history on file.  Family Psychiatric History: Please see initial evaluation for full details. I have reviewed the history. No updates at this time.     Family History: No family history on file.  Social History:  Social History   Socioeconomic History   Marital status: Single    Spouse name: Not on file   Number of children: 0   Years of education: Not on file   Highest education level: 12th grade  Occupational History   Not on file  Tobacco Use   Smoking status: Never   Smokeless tobacco: Never  Vaping Use   Vaping Use: Never used  Substance and Sexual Activity   Alcohol use: Never    Alcohol/week: 0.0 standard drinks   Drug use: Never   Sexual activity: Never  Other Topics Concern   Not on file  Social History Narrative   Not on file   Social Determinants of Health   Financial Resource Strain: Not on file  Food Insecurity: Not on file  Transportation Needs: Not on file  Physical Activity: Not on file  Stress: Not on file  Social Connections: Not on file    Allergies:  Allergies  Allergen Reactions   Penicillins Rash    Metabolic Disorder Labs: No results found for: HGBA1C, MPG No results found for: PROLACTIN No results found for: CHOL, TRIG, HDL, CHOLHDL, VLDL, LDLCALC No results found for: TSH  Therapeutic Level Labs: No results found for: LITHIUM No results found for: VALPROATE No components found for:  CBMZ  Current Medications: Current Outpatient Medications  Medication Sig Dispense Refill   FLUoxetine (PROZAC) 40 MG capsule Take 1 capsule (40 mg  total) by mouth daily. 30 capsule 0   traZODone (DESYREL) 100 MG tablet Take 1 tablet (100 mg total) by mouth at bedtime as needed for sleep. (Patient not taking: Reported on 06/28/2020) 90 tablet 0   XULANE 150-35 MCG/24HR transdermal patch 1 patch once a week.     No current facility-administered medications for this visit.     Musculoskeletal: Strength & Muscle Tone:  N/A Gait & Station:  N/A Patient leans: N/A  Psychiatric Specialty Exam: Review of Systems  There were no vitals taken for this visit.There is no height or weight on file to calculate BMI.  General Appearance: {Appearance:22683}  Eye Contact:  {BHH EYE CONTACT:22684}  Speech:  Clear and Coherent  Volume:  Normal  Mood:  {BHH MOOD:22306}  Affect:  {Affect (PAA):22687}  Thought Process:  Coherent  Orientation:  Full (Time, Place, and Person)  Thought Content: Logical   Suicidal Thoughts:  {ST/HT (PAA):22692}  Homicidal Thoughts:  {ST/HT (PAA):22692}  Memory:  Immediate;   Good  Judgement:  {Judgement (PAA):22694}  Insight:  {Insight (PAA):22695}  Psychomotor Activity:  Normal  Concentration:  Concentration: Good and Attention Span: Good  Recall:  Good  Fund of Knowledge: Good  Language: Good  Akathisia:  No  Handed:  Right  AIMS (if indicated): not done  Assets:  Communication Skills Desire for Improvement  ADL's:  Intact  Cognition: WNL  Sleep:  {BHH GOOD/FAIR/POOR:22877}   Screenings: PHQ2-9    Flowsheet Row Video Visit from 09/01/2020 in [facility]  PHQ-2 Total Score 1      Flowsheet Row Video Visit from 09/01/2020 in [facility] Video Visit from 07/15/2020 in [facility]  C-SSRS RISK CATEGORY No Risk No Risk        Assessment and Plan:  Tamara Peters is a 20 y.o. year old female with a history of depression, who presents for follow up appointment for below.     1. MDD (major depressive disorder),  recurrent episode, mild (HCC) Exam is notable for brighter affect , and there has been overall improvement in depressive symptoms since uptitration of fluoxetine.  Psychosocial stressors includes breaking up with her partner.  She is well engaged with her social connection.  We will continue current dose of fluoxetine to target depression and anxiety.  She was strongly encouraged to contact with the local resources given she will move to [place].    Plan Continue fluoxetine 40 mg daily Continue trazodone 100 mg at night as needed for insomnia-she rarely takes this medication Next appointment: 10/3 at 11:30 for 20 mins, video Obtain labs- TSH She is advised to find a therapist in [place]   The patient demonstrates the following risk factors for suicide: Chronic risk factors for suicide include: psychiatric disorder of depression, anxiety and previous suicide attempts of overdosing medication . Acute risk factors for suicide include: loss (financial, interpersonal, professional) and recent discharge from inpatient psychiatry. Protective factors for this patient include: positive social support and hope for the future. Considering these factors, the overall suicide risk at this point appears to be low. Patient is appropriate for outpatient follow up.           Neysa Hotter, MD 11/15/2020, 5:31 PM

## 2020-11-18 ENCOUNTER — Telehealth: Payer: BC Managed Care – PPO | Admitting: Psychiatry

## 2020-11-18 ENCOUNTER — Telehealth: Payer: Self-pay | Admitting: Psychiatry

## 2020-11-18 ENCOUNTER — Other Ambulatory Visit: Payer: Self-pay

## 2020-11-18 NOTE — Telephone Encounter (Signed)
Sent link for video visit through Epic. Patient did not sign in. Called the patient for appointment scheduled today. The patient did not answer the phone. Left voice message to contact the office ([phone]).   ?

## 2021-01-12 ENCOUNTER — Other Ambulatory Visit: Payer: Self-pay

## 2021-01-12 ENCOUNTER — Encounter: Payer: Self-pay | Admitting: Nurse Practitioner

## 2021-01-12 ENCOUNTER — Ambulatory Visit: Payer: Self-pay | Admitting: Nurse Practitioner

## 2021-01-12 DIAGNOSIS — Z113 Encounter for screening for infections with a predominantly sexual mode of transmission: Secondary | ICD-10-CM

## 2021-01-12 LAB — WET PREP FOR TRICH, YEAST, CLUE
Trichomonas Exam: NEGATIVE
Yeast Exam: NEGATIVE

## 2021-01-12 NOTE — Progress Notes (Addendum)
[facility] Department  STI clinic/screening visit [address] [place] [address] [phone]  Subjective:  Tamara Peters is a 20 y.o. female being seen today for an STI screening visit. The patient reports they do have symptoms.  Patient reports that they do not desire a pregnancy in the next year.   They reported they are not interested in discussing contraception today.    Patient's last menstrual period was 12/31/2020 (approximate).   Patient has the following medical conditions:   Patient Active Problem List   Diagnosis Date Noted   MDD (major depressive disorder), recurrent, in partial remission (HCC) 01/03/2019    Chief Complaint  Patient presents with   SEXUALLY TRANSMITTED DISEASE    Screening    HPI  Patient reports to clinic today for STD screening.  Patient states that a week before thanksgiving she saw a provider in [place], [place]. She was diagnosed with Chlamydia and treated.  She completed her treatment on 12/27/20.  Patient states that on 01/08/21 she began having genital itching and irritation, discharge, and odor when she urinates.  Patient reports that symptoms are different from previous symptoms.    Last HIV test per patient/review of record was 11/2020.   Screening for MPX risk: Does the patient have an unexplained rash? No Is the patient MSM? No Does the patient endorse multiple sex partners or anonymous sex partners? No Did the patient have close or sexual contact with a person diagnosed with MPX? No Has the patient traveled outside the [place] where MPX is endemic? No Is there a high clinical suspicion for MPX-- evidenced by one of the following No  -Unlikely to be chickenpox  -Lymphadenopathy  -Rash that present in same phase of evolution on any given body part See flowsheet for further details and programmatic requirements.    The following portions of the patient's history were reviewed and updated as appropriate:  allergies, current medications, past medical history, past social history, past surgical history and problem list.  Objective:  There were no vitals filed for this visit.  Physical Exam Constitutional:      Appearance: Normal appearance.  HENT:     Head: Normocephalic and atraumatic.     Mouth/Throat:     Comments: No visible dental caries.  Pulmonary:     Effort: Pulmonary effort is normal.  Abdominal:     General: Abdomen is flat.     Palpations: Abdomen is soft.  Genitourinary:    General: Normal vulva.     Rectum: Normal.     Comments: External genitalia/pubic area without nits, lice, edema, erythema, lesions and inguinal adenopathy. Vagina with normal mucosa and discharge. Cervix without visible lesions. Uterus firm, mobile, nt, no masses, no CMT, no adnexal tenderness or fullness. pH 4.5. Musculoskeletal:     Cervical back: Full passive range of motion without pain, normal range of motion and neck supple.  Lymphadenopathy:     Cervical: No cervical adenopathy.  Skin:    General: Skin is warm and dry.  Neurological:     Mental Status: She is alert and oriented to person, place, and time.  Psychiatric:        Mood and Affect: Mood normal.        Behavior: Behavior normal. Behavior is cooperative.     Assessment and Plan:  Tamara Peters is a 20 y.o. female presenting to the [facility] Department for STI screening  1. Screening examination for venereal disease -20 year old  female in clinic today for STD screening. -Patient recently treated for Chlamydia. Advised patient to monitor signs and symptoms over the next two weeks, if no improvement patient may return to clinic for further evaluation. TOC for Chlamydia in 3 months.   -Patient accepted all screenings including vaginal CT/GC and declines bloodwork for HIV/RPR.  Patient meets criteria for HepB screening? No. Ordered? No - low risk  Patient meets criteria for HepC screening? No. Ordered? No - low  risk   Treat wet prep per standing order Discussed time line for [facility] results and that patient will be called with positive results and encouraged patient to call if she had not heard in 2 weeks.  Counseled to return or seek care for continued or worsening symptoms Recommended condom use with all sex  Patient is currently using Hormonal Contraception: Injection, Rings and Patches to prevent pregnancy.   - WET PREP FOR TRICH, YEAST, CLUE - Gonococcus culture  - Chlamydia/Gonorrhea [facility]     Return in 3 months (on 04/12/2021), or if symptoms worsen or fail to improve, for TOC.  No future appointments.  Glenna Fellows, FNP

## 2021-01-12 NOTE — Progress Notes (Signed)
Pt here for STD screening.  Wet mount results reviewed, no treatment required.  Pt declined condoms. .mecrec

## 2021-01-16 LAB — GONOCOCCUS CULTURE

## 2021-01-25 ENCOUNTER — Other Ambulatory Visit: Payer: Self-pay

## 2021-01-25 ENCOUNTER — Ambulatory Visit: Payer: Self-pay

## 2021-01-25 DIAGNOSIS — A749 Chlamydial infection, unspecified: Secondary | ICD-10-CM

## 2021-01-25 MED ORDER — DOXYCYCLINE HYCLATE 100 MG PO TABS: 100.0000 mg | ORAL_TABLET | Freq: Two times a day (BID) | ORAL | 0 refills | Status: AC

## 2021-01-25 NOTE — Progress Notes (Signed)
In nurse clinic for chlamydia treatment. Pt states she was tx for chlamydia end of November 2022 while in [place]. Last sex 01/05/2021 with partner whom she thought had been treated for chlamydia. Chlamydia test positive 01/12/2021. LMP 12/31/2020 and currently has birth control patch. Treated today per [facility] Dr Lubertha Sayres with doxycycline 100 mg  with #14 dispensed by RN and instructions given to take one capsule twice daily for 7 days. To notify [facility] if vomits within 2 hrs of taking med. Questions answered and reports understanding. Josie Saunders, RN

## 2021-09-07 ENCOUNTER — Ambulatory Visit: Payer: BC Managed Care – PPO
